# Patient Record
Sex: Male | Born: 1980 | Race: White | Hispanic: No | Marital: Married | Smoking: Former smoker
Health system: Southern US, Community
[De-identification: ages and names within clinical notes are randomized; demographics above are authoritative.]

## PROBLEM LIST (undated history)

## (undated) DIAGNOSIS — M1990 Unspecified osteoarthritis, unspecified site: Secondary | ICD-10-CM

## (undated) HISTORY — PX: OTHER SURGICAL HISTORY: SHX169

---

## 2011-08-21 ENCOUNTER — Encounter: Payer: Self-pay | Admitting: Family Medicine

## 2011-08-21 ENCOUNTER — Inpatient Hospital Stay (INDEPENDENT_AMBULATORY_CARE_PROVIDER_SITE_OTHER)
Admission: RE | Admit: 2011-08-21 | Discharge: 2011-08-21 | Disposition: A | Discharge status: Home / Self Care | Payer: Managed Care, Other (non HMO) | Source: Ambulatory Visit | Attending: Family Medicine | Admitting: Family Medicine

## 2011-08-21 DIAGNOSIS — L02419 Cutaneous abscess of limb, unspecified: Secondary | ICD-10-CM

## 2011-08-22 ENCOUNTER — Telehealth (INDEPENDENT_AMBULATORY_CARE_PROVIDER_SITE_OTHER): Payer: Self-pay | Admitting: Emergency Medicine

## 2011-10-20 NOTE — Progress Notes (Signed)
Summary: staph infection on right inside of thigh Room 4   Vital Signs:  Patient Profile:   30 Years Old Male CC:      Cellulitis on inside rt thigh dx Tuesday, hurts worse has not gotten any larger Height:     71 inches Weight:      252 pounds O2 Sat:      99 % O2 treatment:    Room Air Temp:     98.4 degrees F oral Pulse rate:   57 / minute Pulse rhythm:   regular Resp:     14 per minute BP sitting:   113 / 73  (left arm) Cuff size:   regular  Vitals Entered By: Emilio Math (August 21, 2011 11:02 AM)                  Current Allergies: No known allergies History of Present Illness Chief Complaint: Cellulitis on inside rt thigh dx Tuesday, hurts worse has not gotten any larger History of Present Illness:  Subjective:  Patient complains of onset of soreness on his right inner thigh about  5 days ago, then developed an area of redness and increased soreness 4 days ago.  No known trauma or insect bite.  No fevers, chills, and sweats.  No rash elsewhere.  No GI, GU, or respiratory symptoms.  He feels well otherwise.  Two days ago he was started on Keflex 500mg  two times a day, and doxycycline 100mg  two times a day, but has seen no improvement.  Current Meds CEPHALEXIN 500 MG CAPS (CEPHALEXIN) One by mouth Q6hr DOXYCYCLINE HYCLATE 100 MG CAPS (DOXYCYCLINE HYCLATE)   REVIEW OF SYSTEMS Constitutional Symptoms      Denies fever, chills, night sweats, weight loss, weight gain, and fatigue.  Eyes       Denies change in vision, eye pain, eye discharge, glasses, contact lenses, and eye surgery. Ear/Nose/Throat/Mouth       Denies hearing loss/aids, change in hearing, ear pain, ear discharge, dizziness, frequent runny nose, frequent nose bleeds, sinus problems, sore throat, hoarseness, and tooth pain or bleeding.  Respiratory       Denies dry cough, productive cough, wheezing, shortness of breath, asthma, bronchitis, and emphysema/COPD.  Cardiovascular       Denies murmurs, chest  pain, and tires easily with exhertion.    Gastrointestinal       Denies stomach pain, nausea/vomiting, diarrhea, constipation, blood in bowel movements, and indigestion. Genitourniary       Denies painful urination, kidney stones, and loss of urinary control. Neurological       Denies paralysis, seizures, and fainting/blackouts. Musculoskeletal       Denies muscle pain, joint pain, joint stiffness, decreased range of motion, redness, swelling, muscle weakness, and gout.  Skin       Denies bruising, unusual mles/lumps or sores, and hair/skin or nail changes.      Comments: Skin redness and soreness Psych       Denies mood changes, temper/anger issues, anxiety/stress, speech problems, depression, and sleep problems.  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  Extraocular movement is intact.  Conjunctivae are not inflamed.  Mouth/pharynx:  No lesions Neck:  Supple.  No adenopathy is present. Lungs:  Clear to auscultation.  Breath sounds are equal.  Heart:  Regular rate and rhythm without murmurs, rubs, or gallops.  Abdomen:  Nontender without masses or hepatosplenomegaly.  Bowel sounds are present.  No CVA or flank tenderness.  Skin:  On right inner thigh is a 12cm dia of macular erythema and slight warmth.  Centrally there is some subcutaneous tender induration about 3cm dia.  No surface lesions.  No fluctuance. CBC:  WBC 8.1 ; LY 28.9, MO 8.3, GR 62.8; Hgb 16.1  Assessment New Problems: CELLULITIS, LEG, RIGHT (ICD-682.6)  SUPERFICIAL CELLULITIS ? ETIOLOGY;  ? MRSA  Plan New Medications/Changes: CEPHALEXIN 500 MG CAPS (CEPHALEXIN) One by  mouth Q6hr  #20 x 0, 08/21/2011, Donna Christen MD  New Orders: CBC w/Diff [[id number]] New Patient Level IV [09811] Planning Comments:   Erythema outlined with skin marker. Continue doxycycline.  Increase Keflex 500mg  to QID.  Elevate.  Begin applying warm compresses.  Return for recheck (or follow-up with PCP) if not improved 48 hours.  Return if develops increased swelling/pain (may need I and D)   The patient and/or caregiver has been counseled thoroughly with regard to medications prescribed including dosage, schedule, interactions, rationale for use, and possible side effects and they verbalize understanding.  Diagnoses and expected course of recovery discussed and will return if not improved as expected or if the condition worsens. Patient and/or caregiver verbalized understanding.  Prescriptions: CEPHALEXIN 500 MG CAPS (CEPHALEXIN) One by mouth Q6hr  #20 x 0   Entered and Authorized by:   Donna Christen MD   Signed by:   Donna Christen MD on 08/21/2011   Method used:   Print then Give to Patient   RxID:   [id number]   Orders Added: 1)  CBC w/Diff [[id number]] 2)  New Patient Level IV [84132]

## 2011-10-20 NOTE — Telephone Encounter (Signed)
  Phone Note Outgoing Call Call back at [facility] Phone [phone]   Call placed by: Emilio Math,  August 22, 2011 3:48 PM Call placed to: Patient Summary of Call: Called patient spoke with wife he has been putting hot compresses and taking meds.

## 2011-10-20 NOTE — Letter (Signed)
Summary: Out of Work  [facility]  [address]   [place], [place] [address]   Phone: [phone]  Fax: [phone]    August 21, 2011   Employee:  Christopher Donaldson    To Whom It May Concern:   For Medical reasons, please excuse the above named employee from work today.   If you need additional information, please feel free to contact our office.         Sincerely,    Donna Christen MD

## 2016-05-01 ENCOUNTER — Encounter: Payer: Self-pay | Admitting: Emergency Medicine

## 2016-05-01 ENCOUNTER — Emergency Department (INDEPENDENT_AMBULATORY_CARE_PROVIDER_SITE_OTHER)
Admission: EM | Admit: 2016-05-01 | Discharge: 2016-05-01 | Disposition: A | Discharge status: Home / Self Care | Payer: BLUE CROSS/BLUE SHIELD | Source: Home / Self Care | Attending: Family Medicine | Admitting: Family Medicine

## 2016-05-01 DIAGNOSIS — J028 Acute pharyngitis due to other specified organisms: Principal | ICD-10-CM

## 2016-05-01 DIAGNOSIS — B9789 Other viral agents as the cause of diseases classified elsewhere: Principal | ICD-10-CM

## 2016-05-01 DIAGNOSIS — J029 Acute pharyngitis, unspecified: Secondary | ICD-10-CM | POA: Diagnosis not present

## 2016-05-01 LAB — POCT RAPID STREP A (OFFICE): Rapid Strep A Screen: NEGATIVE

## 2016-05-01 MED ORDER — AZITHROMYCIN 250 MG PO TABS: ORAL_TABLET | ORAL | Status: DC

## 2016-05-01 NOTE — ED Provider Notes (Signed)
CSN: [id number]     Arrival date & time 05/01/16  1929 History   First MD Initiated Contact with Patient 05/01/16 1951     Chief Complaint  Patient presents with  . Sore Throat     HPI Comments: Patient complains of one week history of typical cold-like symptoms developing over several days,  including mild sore throat, sinus congestion, headache, fatigue, and cough.   The history is provided by the patient.    History reviewed. No pertinent past medical history. History reviewed. No pertinent past surgical history. No family history on file. Social History  Substance Use Topics  . Smoking status: Former Games developermoker  . Smokeless tobacco: None  . Alcohol Use: Yes    Review of Systems + sore throat No cough No pleuritic pain No wheezing + nasal congestion + post-nasal drainage No sinus pain/pressure No itchy/red eyes No earache No hemoptysis No SOB + fever, + chills + nausea No vomiting No abdominal pain + diarrhea, resolved No urinary symptoms No skin rash + fatigue + myalgias + headache Used OTC meds without relief  Allergies  Review of patient's allergies indicates no known allergies.  Home Medications   Prior to Admission medications   Medication Sig Start Date End Date Taking? Authorizing Provider  azithromycin (ZITHROMAX Z-PAK) 250 MG tablet Take 2 tabs today; then begin one tab once daily for 4 more days. (Rx void after 05/09/16) 05/01/16   Lattie HawStephen A Beese, MD   Meds Ordered and Administered this Visit  Medications - No data to display  BP 111/74 mmHg  Pulse 75  Temp(Src) 98.2 F (36.8 C) (Oral)  Ht 5\' 11"  (1.803 m)  Wt 220 lb (99.791 kg)  BMI 30.70 kg/m2  SpO2 100% No data found.   Physical Exam Nursing notes and Vital Signs reviewed. Appearance:  Patient appears stated age, and in no acute distress Eyes:  Pupils are equal, round, and reactive to light and accomodation.  Extraocular movement is intact.  Conjunctivae are not inflamed  Ears:  Canals  normal.  Tympanic membranes normal.  Nose:  Mildly congested turbinates.  No sinus tenderness.    Pharynx:  Minimal erythema Neck:  Supple.  Tender enlarged posterior/lateral nodes are palpated bilaterally  Lungs:  Clear to auscultation.  Breath sounds are equal.  Moving air well. Heart:  Regular rate and rhythm without murmurs, rubs, or gallops.  Abdomen:  Nontender without masses or hepatosplenomegaly.  Bowel sounds are present.  No CVA or flank tenderness.  Extremities:  No edema.  Skin:  No rash present.   ED Course  Procedures none    Labs Reviewed  POCT RAPID STREP A (OFFICE) negative      MDM   1. Acute viral pharyngitis    There is no evidence of bacterial infection today.  Treat symptomatically for now  Take plain guaifenesin (1200mg  extended release tabs such as Mucinex) twice daily, with plenty of water, for cough and congestion.  May add Pseudoephedrine (30mg , one or two every 4 to 6 hours) for sinus congestion.  Get adequate rest.   May use Afrin nasal spray (or generic oxymetazoline) twice daily for about 5 days and then discontinue.  Also recommend using saline nasal spray several times daily and saline nasal irrigation (AYR is a common brand).  Use Flonase nasal spray each morning after using Afrin nasal spray and saline nasal irrigation. Try warm salt water gargles for sore throat.  May take Delsym Cough Suppressant at bedtime for nighttime cough.  Stop all antihistamines for now, and other non-prescription cough/cold preparations. May take Ibuprofen , 4 tabs every 8 hours with food for headache, sore throat, etc. Begin Azithromycin if not improving about 5 days or if persistent fever develops (Given a prescription to hold, with an expiration date)  Follow-up with family doctor if not improving about 7 to10 days.     Lattie Haw, MD 05/05/16 [phone]

## 2016-05-01 NOTE — Discharge Instructions (Signed)
Take plain guaifenesin (1200mg  extended release tabs such as Mucinex) twice daily, with plenty of water, for cough and congestion.  May add Pseudoephedrine (30mg , one or two every 4 to 6 hours) for sinus congestion.  Get adequate rest.   May use Afrin nasal spray (or generic oxymetazoline) twice daily for about 5 days and then discontinue.  Also recommend using saline nasal spray several times daily and saline nasal irrigation (AYR is a common brand).  Use Flonase nasal spray each morning after using Afrin nasal spray and saline nasal irrigation. Try warm salt water gargles for sore throat.  May take Delsym Cough Suppressant at bedtime for nighttime cough.  Stop all antihistamines for now, and other non-prescription cough/cold preparations. May take Ibuprofen 200mg , 4 tabs every 8 hours with food for headache, sore throat, etc. Begin Azithromycin if not improving about 5 days or if persistent fever develops   Follow-up with family doctor if not improving about 7 to10 days.

## 2016-05-01 NOTE — ED Notes (Signed)
Sore throat, chills, sweats, no appetite, sinus drainage, malaise, headache x 1 week

## 2017-01-10 ENCOUNTER — Ambulatory Visit (INDEPENDENT_AMBULATORY_CARE_PROVIDER_SITE_OTHER): Payer: Worker's Compensation | Admitting: Family Medicine

## 2017-01-10 VITALS — BP 108/76 | HR 64 | Temp 98.8°F | Resp 18 | Ht 70.0 in | Wt 227.0 lb

## 2017-01-10 DIAGNOSIS — S7011XA Contusion of right thigh, initial encounter: Secondary | ICD-10-CM

## 2017-01-10 DIAGNOSIS — M25561 Pain in right knee: Secondary | ICD-10-CM | POA: Diagnosis not present

## 2017-01-10 NOTE — Patient Instructions (Addendum)
  Your knee looks good today.  Everything checks out well.    You will be sore for the next several days.  You can take some Tylenol or Advil if you need to for relief.  Ice on your knee can also help with the soreness.  You should be able to return back to work.  If you're having any trouble with your knee in the next several days, come back and see Christopher Donaldson.    It was good to meet you today   IF you received an x-ray today, you will receive an invoice from [facility]. Please contact [facility] at [phone] with questions or concerns regarding your invoice.   IF you received labwork today, you will receive an invoice from [facility]. Please contact [facility] at [phone] with questions or concerns regarding your invoice.   Our billing staff will not be able to assist you with questions regarding bills from these companies.  You will be contacted with the lab results as soon as they are available. The fastest way to get your results is to activate your My Chart account. Instructions are located on the last page of this paperwork. If you have not heard from Christopher Donaldson regarding the results in 2 weeks, please contact this office.

## 2017-01-10 NOTE — Progress Notes (Signed)
Christopher MeierJames Radebaugh 10/26/1981 35 y.o.   Chief Complaint  Patient presents with  . Knee Pain    WC, yesterday    Presents for evaluation of work-related complaint.  Date of Injury: 01/10/2016 - afternoon, unsure exact time  History of Present Illness:  Right leg pain:  Patient works with heavy machinery.  He was at work yesterday when crane slipped and large panel fell onto his leg and floor.  States the panel weighs about 1000 lbs.  Floor took most of the brunt of the fall, but he was still pinned underneath.  Coworkers able to lift the panel off of him.    He had some immediate pain and soreness, but was able to "walk it off."  He was also able to finish his shift.  Worked for the next severla hours, including Bending, squatting, climbing up onto a truck. He noted some bruising along his thigh. He also had some soreness in his right knee. However he did not noticed any bruising along his knee. He noted a scrape along the front of his shin without much pain.  Went home last night.  Able to sleep well.  Felt "a little sore" this AM.  Was not going to come be evaluated except states that wife "nagged" him into coming.  Has not needed any OTC analgesics.    No injury to either knee prior.  Has had "crushed ankle" in past.  Has chronic swelling and bruising here.    ROS The patient denies fever, unusual weight change, decreased hearing, chest pain, palpitations, pre-syncopal or syncopal episodes, dyspnea on exertion, prolonged cough, hemoptysis, change in bowel habits, melena, hematochezia,  nausea/vomiting/abdominal pain, abnormal bleeding, or enlarged lymph nodes.  No muscle weakness.  Soreness as above.    Current medications and allergies reviewed and updated. Past medical history, family history, social history have been reviewed and updated.   Physical Exam  Gen:  Alert, cooperative patient who appears stated age in no acute distress.  Vital signs reviewed. Head: Kelly/AT.   Eyes:   EOMI, PERRL.   Ears:  External ears WNL, Bilateral TM's normal without retraction, redness or bulging. Nose:  Septum midline  Mouth:  MMM, tonsils non-erythematous, non-edematous.   Cardiac:  Regular rate and rhythm without murmur auscultated.  Good S1/S2. Pulm:  Clear to auscultation bilaterally with good air movement.  No wheezes or rales noted.   Abd:  Soft/nondistended/nontender.  Good bowel sounds throughout all four quadrants.  No masses noted.  Ext:  Some mild swelling of Right ankle with varicose veins and no acute bruising here. Does have very superficial abrasion noted anterior aspect of Right shin but without bruising beneath and without any pain.  No LE edema on Left.   MSK:  Left leg WNL - Right leg w/ bruising anterior aspect Right thigh about 3 cm in diameter.  Minimally TTP along bruise but thigh otherwise without pain/tenderness.  Able to fully flex knee without pain.  Does ambulate with slight antalgic gain.  No edema to right knee. No effusion. No bruising of knee. No tenderness to palpation along medial lateral joint line. No patellar tenderness. No crepitus noted. No patellofemoral pain. Anterior-posterior drawer tests are negative. Lachman is negative. McMurray's daily. No medial or lateral joint laxity. No pain or tenderness with varus and valgus testing.     Assessment and Plan: 1.  Right leg contusion: - s/p large panel falling on his leg.  Sounds like the ground took the majority  of the weight of this panel for which he is fortunate.   - complete testing today is negative.  He himself wasn't worried that he is injured, he states he only came because his wife forced him to come. - Able to finish his work yesterday fully.   - does have some mild soreness today as expected, but not enough to warrant OTC analgesics - can take these if pain persists.  - if worsens or otherwise he is not improving in next several days, should follow up for re-examination - able to return back  to work without restrictions, which he did yesterday himself anyway.

## 2018-02-22 ENCOUNTER — Other Ambulatory Visit: Payer: Self-pay | Admitting: Occupational Medicine

## 2018-02-22 ENCOUNTER — Ambulatory Visit: Payer: Self-pay

## 2018-02-22 DIAGNOSIS — M25521 Pain in right elbow: Secondary | ICD-10-CM

## 2018-04-04 ENCOUNTER — Encounter: Payer: Self-pay | Admitting: Emergency Medicine

## 2018-04-04 ENCOUNTER — Emergency Department
Admission: EM | Admit: 2018-04-04 | Discharge: 2018-04-04 | Disposition: A | Discharge status: Home / Self Care | Payer: BLUE CROSS/BLUE SHIELD | Source: Home / Self Care | Attending: Family Medicine | Admitting: Family Medicine

## 2018-04-04 DIAGNOSIS — J069 Acute upper respiratory infection, unspecified: Secondary | ICD-10-CM

## 2018-04-04 MED ORDER — AZITHROMYCIN 250 MG PO TABS: 250.0000 mg | ORAL_TABLET | Freq: Every day | ORAL | 0 refills | Status: DC

## 2018-04-04 NOTE — ED Triage Notes (Signed)
Patient complaining of dry cough x 1 week, chest tightness, sinus problems, headaches, no fever, has taken Mucinex and Tessalon Pearles.

## 2018-04-04 NOTE — Discharge Instructions (Signed)
°  You may take  acetaminophen every 4-6 hours or in combination with ibuprofen 400-600mg  every 6-8 hours as needed for pain, inflammation, and fever.  Be sure to drink at least eight 8oz glasses of water to stay well hydrated and get at least 8 hours of sleep at night, preferably more while sick.   You may continue to take muccinex or try a different over the counter cough medication for at night such as Nyquil, which has benadryl to help with cough and sleep.  Generic over the counter medications are okay to use as well.   Please take antibiotics as prescribed and be sure to complete entire course even if you start to feel better to ensure infection does not come back.  Please follow up with a family medicine doctor in 7-10 days if not improving.

## 2018-04-04 NOTE — ED Provider Notes (Signed)
Christopher Donaldson [facility]    CSN: [id number] Arrival date & time: 04/04/18  1612     History   Chief Complaint Chief Complaint  Patient presents with  . Cough    HPI Christopher Donaldson is a 37 y.o. male.   HPI  Christopher Donaldson is a 37 y.o. male presenting to UC with c/o 1 week worsening cough with chest tightness and sinus pressure and congestion with HAs.  He was in the [place] last week on a cruise but wife believes he got sick sitting in the airport.  He has taken mucinex and tried tessalon with minimal relief.  Subjective fever the first 2 days with sweats and chills.  No n/v/d. Denies SOB. No hx of asthma.    History reviewed. No pertinent past medical history.  There are no active problems to display for this patient.   History reviewed. No pertinent surgical history.     Home Medications    Prior to Admission medications   Medication Sig Start Date End Date Taking? Authorizing Provider  azithromycin (ZITHROMAX) 250 MG tablet Take 1 tablet (250 mg total) by mouth daily. Take first 2 tablets together, then 1 every day until finished. 04/04/18   Lurene Shadow, PA-C    Family History No family history on file.  Social History Social History   Tobacco Use  . Smoking status: Current Every Day Smoker  . Smokeless tobacco: Never Used  Substance Use Topics  . Alcohol use: Yes  . Drug use: Not on file     Allergies   Patient has no known allergies.   Review of Systems Review of Systems  Constitutional: Positive for fever. Negative for chills.  HENT: Positive for congestion, rhinorrhea, sinus pressure and sinus pain. Negative for ear pain, sore throat, trouble swallowing and voice change.   Respiratory: Positive for cough and chest tightness. Negative for shortness of breath.   Cardiovascular: Negative for chest pain and palpitations.  Gastrointestinal: Negative for abdominal pain, diarrhea, nausea and vomiting.  Musculoskeletal: Negative for arthralgias, back pain  and myalgias.  Skin: Negative for rash.  Neurological: Positive for headaches. Negative for dizziness and light-headedness.     Physical Exam Triage Vital Signs ED Triage Vitals [04/04/18 1639]  Enc Vitals Group     BP 115/70     Pulse Rate 80     Resp      Temp 98.3 F (36.8 C)     Temp Source Oral     SpO2 99%     Weight 217 lb 4 oz (98.5 kg)     Height  (1.803 m)     Head Circumference      Peak Flow      Pain Score 0     Pain Loc      Pain Edu?      Excl. in GC?    No data found.  Updated Vital Signs BP 115/70 (BP Location: Right Arm)   Pulse 80   Temp 98.3 F (36.8 C) (Oral)   Ht  (1.803 m)   Wt 217 lb 4 oz (98.5 kg)   SpO2 99%   BMI 30.30 kg/m   Visual Acuity Right Eye Distance:   Left Eye Distance:   Bilateral Distance:    Right Eye Near:   Left Eye Near:    Bilateral Near:     Physical Exam  Constitutional: He is oriented to person, place, and time. He appears well-developed and well-nourished. No distress.  HENT:  Head: Normocephalic and atraumatic.  Right Ear: Tympanic membrane normal.  Left Ear: Tympanic membrane normal.  Nose: Mucosal edema present. Right sinus exhibits no maxillary sinus tenderness and no frontal sinus tenderness. Left sinus exhibits no maxillary sinus tenderness and no frontal sinus tenderness.  Mouth/Throat: Uvula is midline, oropharynx is clear and moist and mucous membranes are normal.  Eyes: EOM are normal.  Neck: Normal range of motion. Neck supple.  Cardiovascular: Normal rate and regular rhythm.  Pulmonary/Chest: Effort normal and breath sounds normal. No stridor. No respiratory distress. He has no wheezes. He has no rales.  Musculoskeletal: Normal range of motion.  Lymphadenopathy:    He has no cervical adenopathy.  Neurological: He is alert and oriented to person, place, and time.  Skin: Skin is warm and dry. He is not diaphoretic.  Psychiatric: He has a normal mood and affect. His behavior is normal.   Nursing note and vitals reviewed.    UC Treatments / Results  Labs (all labs ordered are listed, but only abnormal results are displayed) Labs Reviewed - No data to display  EKG None  Radiology No results found.  Procedures Procedures (including critical care time)  Medications Ordered in UC Medications - No data to display  Initial Impression / Assessment and Plan / UC Course  I have reviewed the triage vital signs and the nursing notes.  Pertinent labs & imaging results that were available during my care of the patient were reviewed by me and considered in my medical decision making (see chart for details).     Will cover for atypical bacterial infection given recent travel. Home care instructions provided below.    Final Clinical Impressions(s) / UC Diagnoses   Final diagnoses:  Upper respiratory tract infection, unspecified type     Discharge Instructions      You may take  acetaminophen every 4-6 hours or in combination with ibuprofen 400-600mg  every 6-8 hours as needed for pain, inflammation, and fever.  Be sure to drink at least eight 8oz glasses of water to stay well hydrated and get at least 8 hours of sleep at night, preferably more while sick.   You may continue to take muccinex or try a different over the counter cough medication for at night such as Nyquil, which has benadryl to help with cough and sleep.  Generic over the counter medications are okay to use as well.   Please take antibiotics as prescribed and be sure to complete entire course even if you start to feel better to ensure infection does not come back.  Please follow up with a family medicine doctor in 7-10 days if not improving.     ED Prescriptions    Medication Sig Dispense Auth. Provider   azithromycin (ZITHROMAX) 250 MG tablet Take 1 tablet (250 mg total) by mouth daily. Take first 2 tablets together, then 1 every day until finished. 6 tablet Lurene Shadow, PA-C      Controlled Substance Prescriptions [place] Controlled Substance Registry consulted? Not Applicable   Rolla Plate 04/04/18 6045

## 2018-09-23 IMAGING — DX DG ELBOW COMPLETE 3+V*R*
4 series · 4 of 4 positions shown · non-contrast
Comparison: None.

CLINICAL DATA: Fell on 02/20/2018. Posterior elbow pain with
abrasions.

EXAM:
RIGHT ELBOW - COMPLETE 3+ VIEW

[elbow ap]
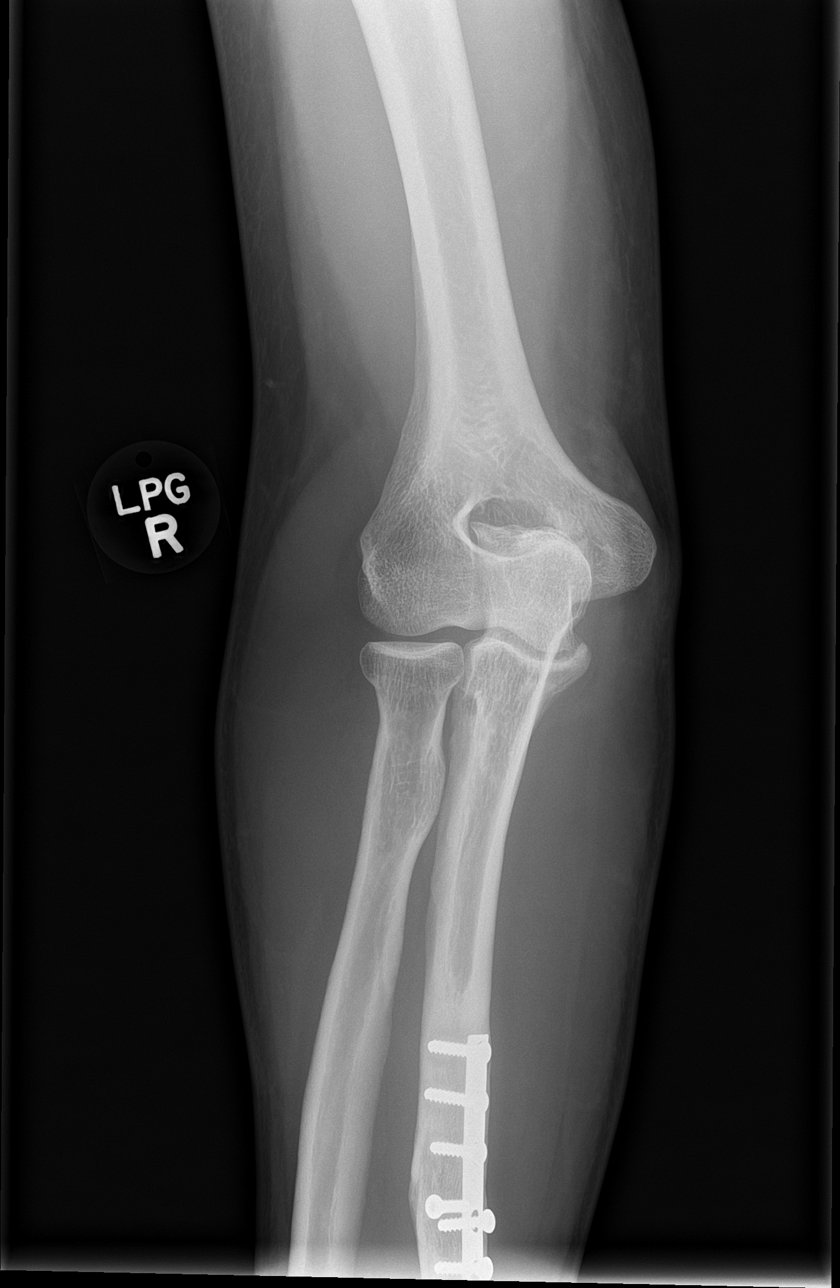

[elbow obl (1 of 2)]
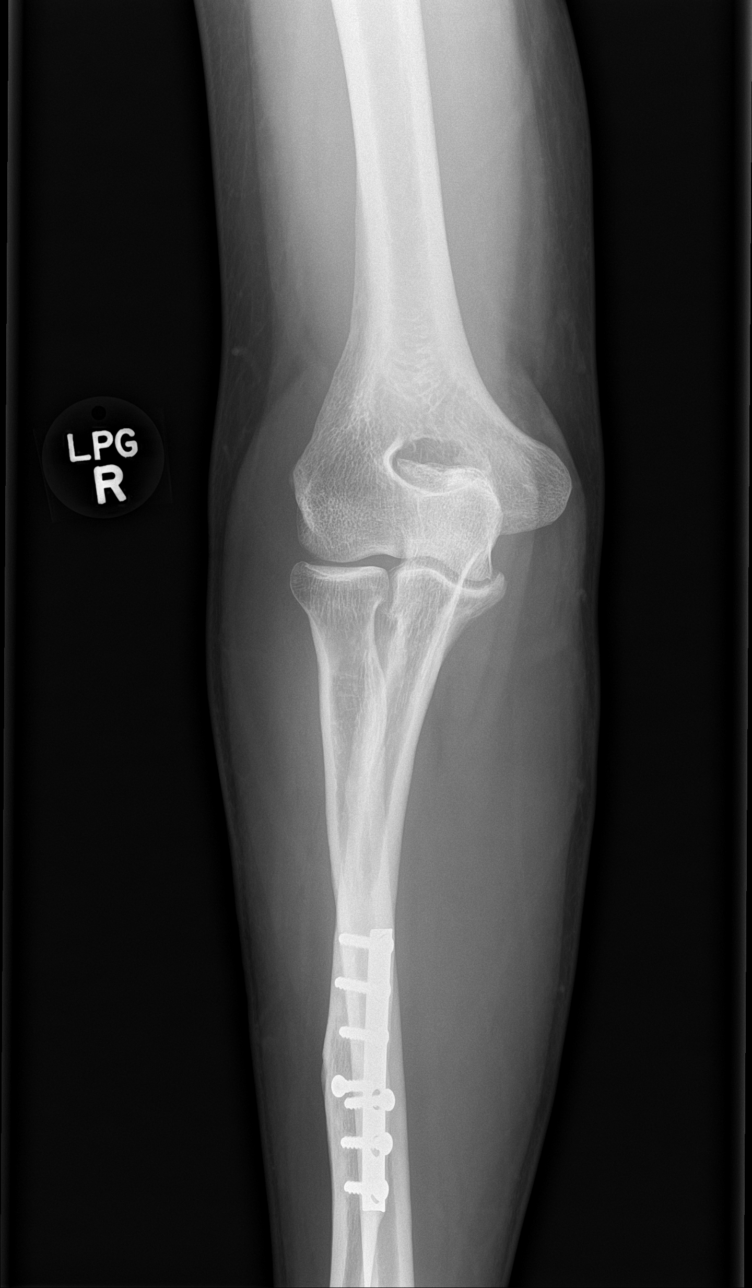

[elbow obl (2 of 2)]
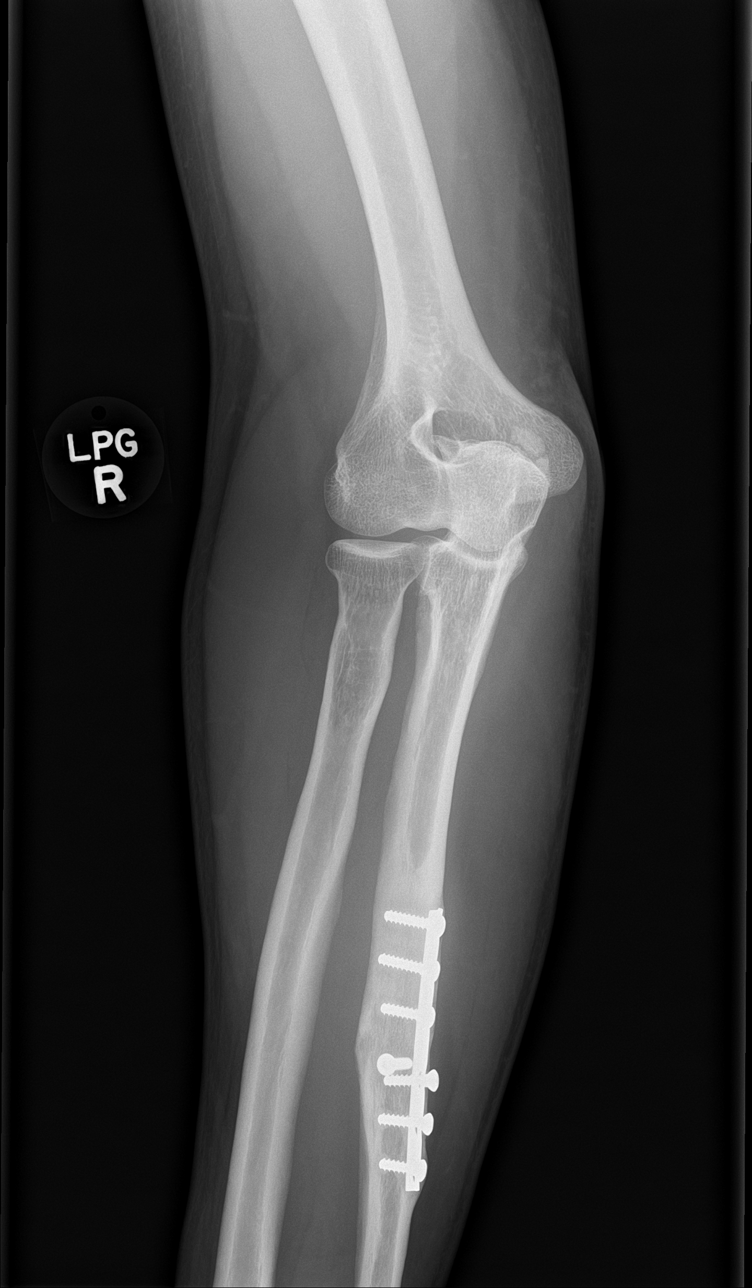

[elbow lat]
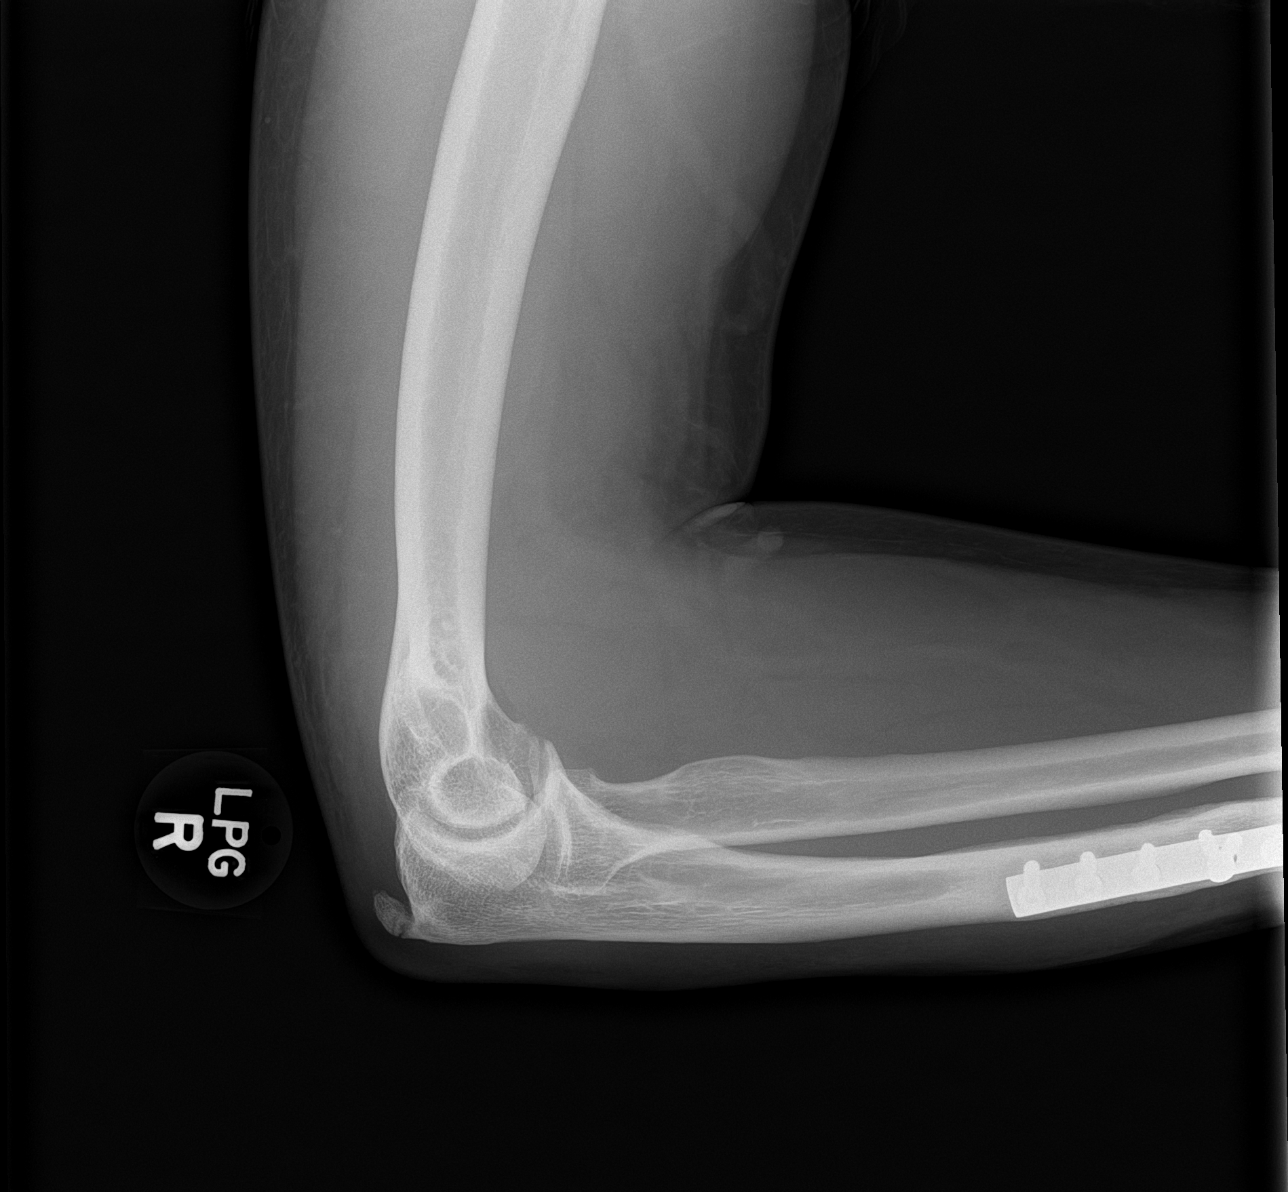

[4 of 4 positions shown; findings below may reference images not displayed]

FINDINGS: Plate and screw fixation in the mid ulna. The right elbow is
located. Prominent spur at the olecranon compatible with
enthesopathic changes. Negative for an acute fracture. No evidence
for an elbow joint effusion. Degenerative changes at the coronoid
process of the ulna.
IMPRESSION: No acute bone abnormality to the right elbow.

Degenerative and post surgical changes as described.

## 2019-08-01 ENCOUNTER — Other Ambulatory Visit: Payer: Self-pay

## 2019-08-01 ENCOUNTER — Emergency Department
Admission: EM | Admit: 2019-08-01 | Discharge: 2019-08-01 | Disposition: A | Discharge status: Home / Self Care | Payer: BC Managed Care – PPO | Source: Home / Self Care

## 2019-08-01 ENCOUNTER — Encounter: Payer: Self-pay | Admitting: Emergency Medicine

## 2019-08-01 DIAGNOSIS — M5412 Radiculopathy, cervical region: Secondary | ICD-10-CM | POA: Diagnosis not present

## 2019-08-01 DIAGNOSIS — S46811A Strain of other muscles, fascia and tendons at shoulder and upper arm level, right arm, initial encounter: Secondary | ICD-10-CM

## 2019-08-01 MED ORDER — CYCLOBENZAPRINE HCL 5 MG PO TABS: 5.0000 mg | ORAL_TABLET | Freq: Two times a day (BID) | ORAL | 0 refills | Status: AC | PRN

## 2019-08-01 MED ORDER — PREDNISONE 50 MG PO TABS: 50.0000 mg | ORAL_TABLET | Freq: Every day | ORAL | 0 refills | Status: AC

## 2019-08-01 NOTE — ED Triage Notes (Signed)
RT shoulder and neck pain, tingling in RT hand, headaches x 3 weeks

## 2019-08-01 NOTE — ED Provider Notes (Signed)
[facility]    CSN: [id number] Arrival date & time: 08/01/19  1602      History   Chief Complaint Chief Complaint  Patient presents with  . Shoulder Pain    HPI Christopher Donaldson is a 38 y.o. male.   HPI  Christopher Donaldson is a 38 y.o. male presenting to UC with c/o persistent Right side neck and shoulder pain, with intermittent Right hand numbness and mild generalized HA for about 3 weeks.  He is Right hand dominant. No known injury.  He has tried Tylenol on occasion with mild relief.  Pain is aching and sore, 7/10.     History reviewed. No pertinent past medical history.  There are no active problems to display for this patient.   History reviewed. No pertinent surgical history.     Home Medications    Prior to Admission medications   Medication Sig Start Date End Date Taking? Authorizing Provider  cyclobenzaprine (FLEXERIL) 5 MG tablet Take 1-2 tablets (5-10 mg total) by mouth 2 (two) times daily as needed for muscle spasms. 08/01/19   Noe Gens, PA-C  predniSONE (DELTASONE) 50 MG tablet Take 1 tablet (50 mg total) by mouth daily with breakfast for 5 days. 08/01/19 08/06/19  Noe Gens, PA-C    Family History Family History  Problem Relation Age of Onset  . Healthy Mother   . Healthy Father     Social History Social History   Tobacco Use  . Smoking status: Current Every Day Smoker  . Smokeless tobacco: Never Used  Substance Use Topics  . Alcohol use: Yes  . Drug use: Not on file     Allergies   Patient has no known allergies.   Review of Systems Review of Systems  Constitutional: Negative for chills and fever.  Musculoskeletal: Positive for arthralgias, back pain, myalgias and neck pain. Negative for joint swelling and neck stiffness.  Skin: Negative for color change.  Neurological: Positive for numbness. Negative for weakness.     Physical Exam Triage Vital Signs ED Triage Vitals  Enc Vitals Group     BP 08/01/19 1653 117/71    Pulse Rate 08/01/19 1653 (!) 54     Resp --      Temp 08/01/19 1653 98 F (36.7 C)     Temp Source 08/01/19 1653 Oral     SpO2 --      Weight 08/01/19 1654 230 lb (104.3 kg)     Height 08/01/19 1654 5\' 11"  (1.803 m)     Head Circumference --      Peak Flow --      Pain Score 08/01/19 1654 7     Pain Loc --      Pain Edu? --      Excl. in [place]? --    No data found.  Updated Vital Signs BP 117/71 (BP Location: Right Arm)   Pulse (!) 54   Temp 98 F (36.7 C) (Oral)   Ht 5\' 11"  (1.803 m)   Wt 230 lb (104.3 kg)   BMI 32.08 kg/m   Visual Acuity Right Eye Distance:   Left Eye Distance:   Bilateral Distance:    Right Eye Near:   Left Eye Near:    Bilateral Near:     Physical Exam Vitals signs and nursing note reviewed.  Constitutional:      Appearance: Normal appearance. He is well-developed.  HENT:     Head: Normocephalic and atraumatic.     Mouth/Throat:  Mouth: Mucous membranes are moist.  Neck:     Musculoskeletal: Normal range of motion and neck supple. No neck rigidity or muscular tenderness.  Cardiovascular:     Rate and Rhythm: Normal rate and regular rhythm.     Pulses:          Radial pulses are 2+ on the right side.  Pulmonary:     Effort: Pulmonary effort is normal.  Musculoskeletal: Normal range of motion.        General: Tenderness present.       Back:     Comments: No spinal tenderness. Mild tenderness to Right upper trapezius. Full ROM Right shoulder, elbow and hand w/o crepitus or pain. 5/5 grip strength bilaterally.  Skin:    General: Skin is warm and dry.     Capillary Refill: Capillary refill takes less than 2 seconds.  Neurological:     General: No focal deficit present.     Mental Status: He is alert and oriented to person, place, and time.  Psychiatric:        Behavior: Behavior normal.      UC Treatments / Results  Labs (all labs ordered are listed, but only abnormal results are displayed) Labs Reviewed - No data to display   EKG   Radiology No results found.  Procedures Procedures (including critical care time)  Medications Ordered in UC Medications - No data to display  Initial Impression / Assessment and Plan / UC Course  I have reviewed the triage vital signs and the nursing notes.  Pertinent labs & imaging results that were available during my care of the patient were reviewed by me and considered in my medical decision making (see chart for details).     Hx and exam c/w muscular strain with Right side cervical radiculopathy  Will tx conservatively and hold off on imaging at this time.   Final Clinical Impressions(s) / UC Diagnoses   Final diagnoses:  Right cervical radiculopathy  Trapezius strain, right, initial encounter     Discharge Instructions      Flexeril (cyclobenzaprine) is a muscle relaxer and may cause drowsiness. Do not drink alcohol, drive, or operate heavy machinery while taking.  You may take 500mg  acetaminophen every 4-6 hours or in combination with ibuprofen 400-600mg  every 6-8 hours as needed for pain and inflammation.   Please call to schedule a follow up visit with your family doctor or Sports Medicine in 1 week if not improving.     ED Prescriptions    Medication Sig Dispense Auth. Provider   cyclobenzaprine (FLEXERIL) 5 MG tablet Take 1-2 tablets (5-10 mg total) by mouth 2 (two) times daily as needed for muscle spasms. 30 tablet Doroteo GlassmanPhelps, Tayjon Halladay O, PA-C   predniSONE (DELTASONE) 50 MG tablet Take 1 tablet (50 mg total) by mouth daily with breakfast for 5 days. 5 tablet Lurene ShadowPhelps, Laurent Cargile O, PA-C     Controlled Substance Prescriptions [place] Controlled Substance Registry consulted? Not Applicable   Rolla Platehelps, Hannan Tetzlaff O, PA-C 08/02/19 [id number]

## 2019-08-01 NOTE — Discharge Instructions (Signed)
°  Flexeril (cyclobenzaprine) is a muscle relaxer and may cause drowsiness. Do not drink alcohol, drive, or operate heavy machinery while taking.  You may take 500mg  acetaminophen every 4-6 hours or in combination with ibuprofen 400-600mg  every 6-8 hours as needed for pain and inflammation.   Please call to schedule a follow up visit with your family doctor or Sports Medicine in 1 week if not improving.

## 2021-01-17 ENCOUNTER — Other Ambulatory Visit: Payer: Self-pay

## 2021-01-17 ENCOUNTER — Emergency Department (HOSPITAL_BASED_OUTPATIENT_CLINIC_OR_DEPARTMENT_OTHER)
Admission: EM | Admit: 2021-01-17 | Discharge: 2021-01-17 | Disposition: A | Discharge status: Home / Self Care | Payer: Worker's Compensation | Attending: Emergency Medicine | Admitting: Emergency Medicine

## 2021-01-17 ENCOUNTER — Encounter (HOSPITAL_BASED_OUTPATIENT_CLINIC_OR_DEPARTMENT_OTHER): Payer: Self-pay | Admitting: *Deleted

## 2021-01-17 ENCOUNTER — Emergency Department (HOSPITAL_BASED_OUTPATIENT_CLINIC_OR_DEPARTMENT_OTHER): Payer: Worker's Compensation

## 2021-01-17 DIAGNOSIS — S61211A Laceration without foreign body of left index finger without damage to nail, initial encounter: Secondary | ICD-10-CM | POA: Diagnosis present

## 2021-01-17 DIAGNOSIS — W208XXA Other cause of strike by thrown, projected or falling object, initial encounter: Secondary | ICD-10-CM | POA: Insufficient documentation

## 2021-01-17 DIAGNOSIS — Y9289 Other specified places as the place of occurrence of the external cause: Secondary | ICD-10-CM | POA: Insufficient documentation

## 2021-01-17 DIAGNOSIS — Z23 Encounter for immunization: Secondary | ICD-10-CM | POA: Insufficient documentation

## 2021-01-17 DIAGNOSIS — F17200 Nicotine dependence, unspecified, uncomplicated: Secondary | ICD-10-CM | POA: Insufficient documentation

## 2021-01-17 HISTORY — DX: Unspecified osteoarthritis, unspecified site: M19.90

## 2021-01-17 MED ORDER — TETANUS-DIPHTH-ACELL PERTUSSIS 5-2.5-18.5 LF-MCG/0.5 IM SUSY
0.5000 mL | PREFILLED_SYRINGE | Freq: Once | INTRAMUSCULAR | Status: AC
  Administered 2021-01-17: 0.5 mL via INTRAMUSCULAR
  Filled 2021-01-17: qty 0.5

## 2021-01-17 MED ORDER — LIDOCAINE HCL (PF) 1 % IJ SOLN
5.0000 mL | Freq: Once | INTRAMUSCULAR | Status: AC
  Administered 2021-01-17: 5 mL
  Filled 2021-01-17: qty 5

## 2021-01-17 NOTE — ED Triage Notes (Signed)
Workman's comp injury. A metal panel fell causing a laceration to his left index finger. Bleeding controlled.

## 2021-01-17 NOTE — Discharge Instructions (Addendum)
Have the stitches removed in 7 to 10 days.  He can return to the ER or follow-up with your doctor or another urgent care.  Watch for signs of infection.

## 2021-01-17 NOTE — ED Provider Notes (Signed)
[facility] EMERGENCY DEPARTMENT Provider Note   CSN: [id number] Arrival date & time: 01/17/21  1434     History Chief Complaint  Patient presents with  . Laceration    Christopher Donaldson is a 40 y.o. male.  HPI Patient presents with injury to the palmar aspect of his left index finger.  States it was a piece of metal at work.  It has been wrapped up and he states he did rinse it out a little bit.  States when he removed the dressing it bled more.  No other injury.  Unknown last tetanus.  Denies numbness or weakness.  States he is still able to bend it.  Not on anticoagulation.  States he is otherwise healthy.    Past Medical History:  Diagnosis Date  . Arthritis     There are no problems to display for this patient.   Past Surgical History:  Procedure Laterality Date  . arm surgery         Family History  Problem Relation Age of Onset  . Healthy Mother   . Healthy Father     Social History   Tobacco Use  . Smoking status: Current Every Day Smoker  . Smokeless tobacco: Never Used  Vaping Use  . Vaping Use: Never used  Substance Use Topics  . Alcohol use: Not Currently  . Drug use: Never    Home Medications Prior to Admission medications   Medication Sig Start Date End Date Taking? Authorizing Provider  cyclobenzaprine (FLEXERIL) 5 MG tablet Take 1-2 tablets (5-10 mg total) by mouth 2 (two) times daily as needed for muscle spasms. 08/01/19   Lurene Shadow, PA-C    Allergies    Patient has no known allergies.  Review of Systems   Review of Systems  Constitutional: Negative for fever.  Musculoskeletal:       Left index finger injury.  Skin: Positive for wound.  Neurological: Negative for weakness and numbness.    Physical Exam Updated Vital Signs BP 117/74 (BP Location: Right Arm)   Pulse 66   Temp 98.2 F (36.8 C) (Oral)   Resp 18   Ht 5\' 11"  (1.803 m)   Wt 94.8 kg   SpO2 100%   BMI 29.15 kg/m   Physical Exam Vitals and nursing  note reviewed.  Constitutional:      Appearance: Normal appearance.  Cardiovascular:     Rate and Rhythm: Regular rhythm.  Musculoskeletal:     Comments: Laceration across the palmar aspect of the distal phalanx of the left finger.  Approximately 1.5 cm long.  Appears to have good flexion and extension at the DIP PIP and MCP joint.  Does not involve the nail.  Skin:    Capillary Refill: Capillary refill takes less than 2 seconds.  Neurological:     Mental Status: He is alert and oriented to person, place, and time.     ED Results / Procedures / Treatments   Labs (all labs ordered are listed, but only abnormal results are displayed) Labs Reviewed - No data to display  EKG None  Radiology DG Finger Index Left  Result Date: 01/17/2021 CLINICAL DATA:  Left index finger laceration. EXAM: LEFT INDEX FINGER 2+V COMPARISON:  None. FINDINGS: Radiopaque gauze is seen overlying the left index finger with subsequently obscured osseous and soft tissue detail. There is no evidence of fracture or dislocation. There is no evidence of arthropathy or other focal bone abnormality. Soft tissues are unremarkable. IMPRESSION: Negative. Electronically  Signed   By: Aram Candela M.D.   On: 01/17/2021 15:32    Procedures .Marland KitchenLaceration Repair  Date/Time: 01/17/2021 7:35 PM Performed by: Benjiman Core, MD Authorized by: Benjiman Core, MD   Consent:    Consent obtained:  Verbal   Consent given by:  Patient   Risks, benefits, and alternatives were discussed: yes     Risks discussed:  Infection, poor cosmetic result, poor wound healing, retained foreign body, pain, need for additional repair and nerve damage   Alternatives discussed:  No treatment Universal protocol:    Procedure explained and questions answered to patient or proxy's satisfaction: yes     Patient identity confirmed:  Verbally with patient Anesthesia:    Anesthesia method:  Local infiltration and nerve block   Local  anesthetic:  Lidocaine 1% w/o epi   Block location:  Digital block left index finger   Block needle gauge:  27 G   Block anesthetic:  Lidocaine 1% w/o epi   Block technique:  Digital block   Block injection procedure:  Anatomic landmarks identified, introduced needle, incremental injection and negative aspiration for blood   Block outcome:  Incomplete block Laceration details:    Location:  Finger   Finger location:  L index finger   Length (cm):  2 Pre-procedure details:    Preparation:  Patient was prepped and draped in usual sterile fashion and imaging obtained to evaluate for foreign bodies Exploration:    Hemostasis achieved with:  Tourniquet   Imaging obtained: x-ray     Imaging outcome: foreign body not noted     Wound exploration: wound explored through full range of motion     Contaminated: no   Treatment:    Area cleansed with:  Saline   Amount of cleaning:  Standard   Irrigation solution:  Tap water   Irrigation method:  Tap and syringe   Visualized foreign bodies/material removed: yes     Debridement:  None   Undermining:  None   Scar revision: no   Skin repair:    Repair method:  Sutures   Suture size:  5-0   Suture material:  Prolene   Suture technique:  Simple interrupted   Number of sutures: Approximately 8. Approximation:    Approximation:  Close Repair type:    Repair type:  Intermediate Post-procedure details:    Dressing:  Sterile dressing   Procedure completion:  Tolerated well, no immediate complications Comments:     Also 1 deep suture of simple interrupted out of Vicryl Rapide.     Medications Ordered in ED Medications  lidocaine (PF) (XYLOCAINE) 1 % injection 5 mL (5 mLs Infiltration Given by Other 01/17/21 1601)  Tdap (BOOSTRIX) injection 0.5 mL (0.5 mLs Intramuscular Given 01/17/21 1601)    ED Course  I have reviewed the triage vital signs and the nursing notes.  Pertinent labs & imaging results that were available during my care of the  patient were reviewed by me and considered in my medical decision making (see chart for details).    MDM Rules/Calculators/A&P                          Patient with finger laceration.  X-ray reassuring.  Wound closed with sutures.  Tetanus updated.  No other apparent injury.  Follow-up as an outpatient for suture removal in 7 to 10 days. Final Clinical Impression(s) / ED Diagnoses Final diagnoses:  Laceration of left index finger without foreign body  without damage to nail, initial encounter    Rx / DC Orders ED Discharge Orders    None       Benjiman Core, MD 01/17/21 [phone]

## 2021-08-18 IMAGING — DX DG FINGER INDEX 2+V*L*
3 series · 3 of 3 positions shown · non-contrast
Comparison: None.

CLINICAL DATA: Left index finger laceration.

EXAM:
LEFT INDEX FINGER 2+V

[finger ap]
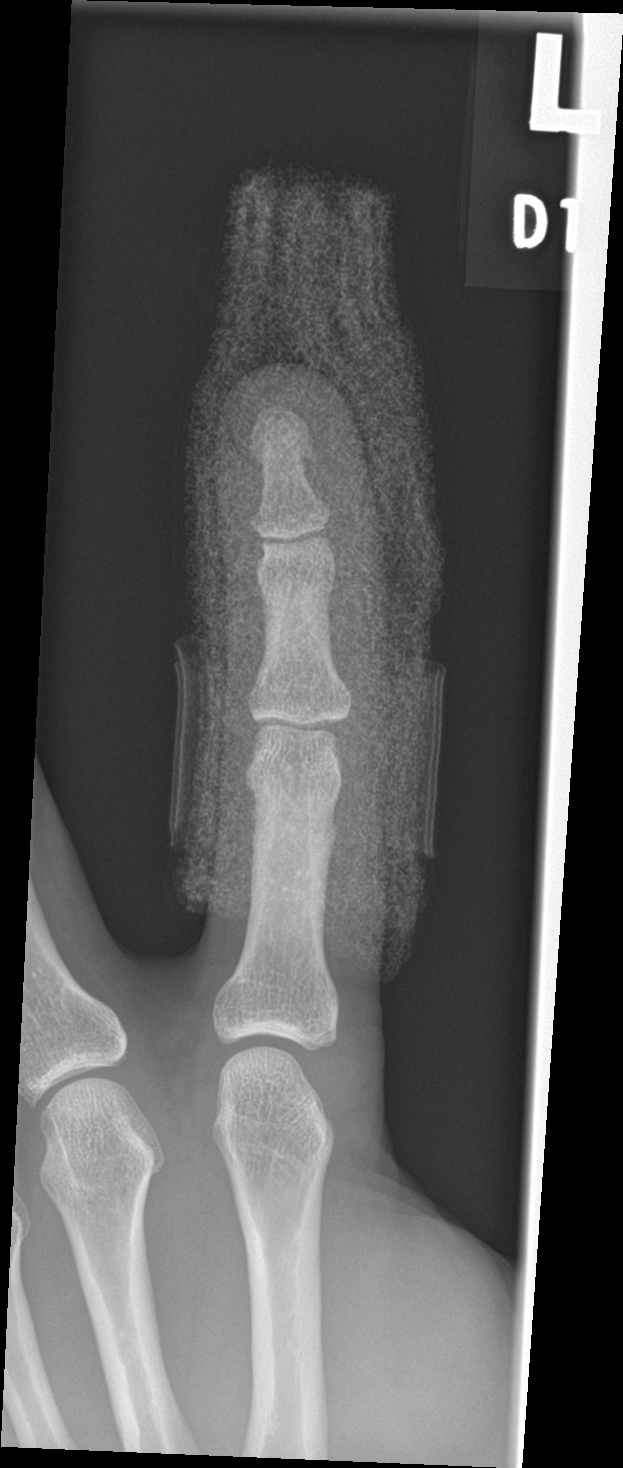

[finger obl]
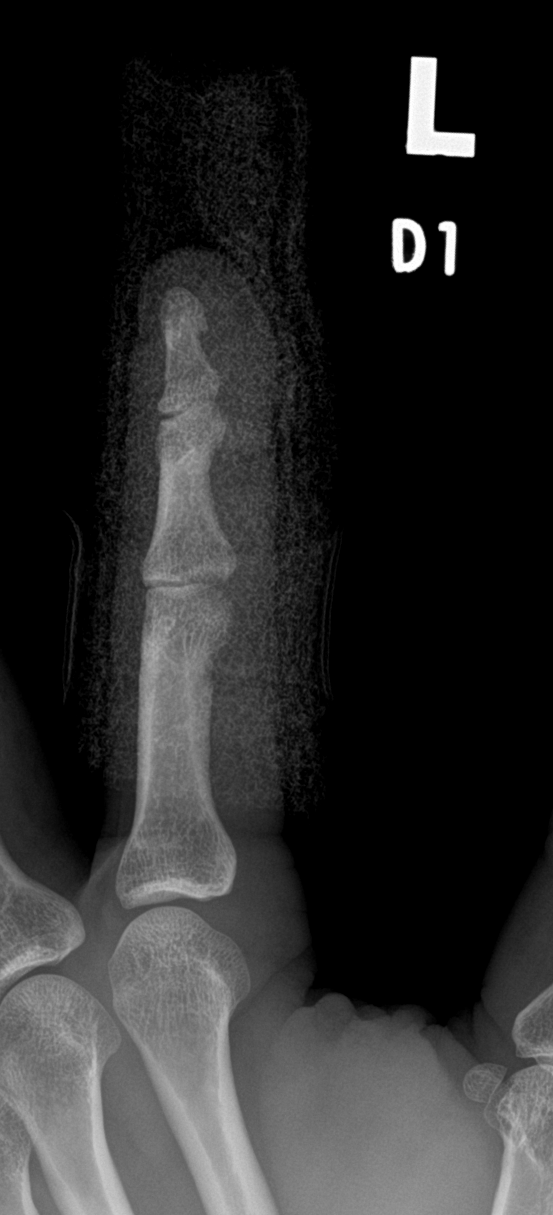

[finger lat]
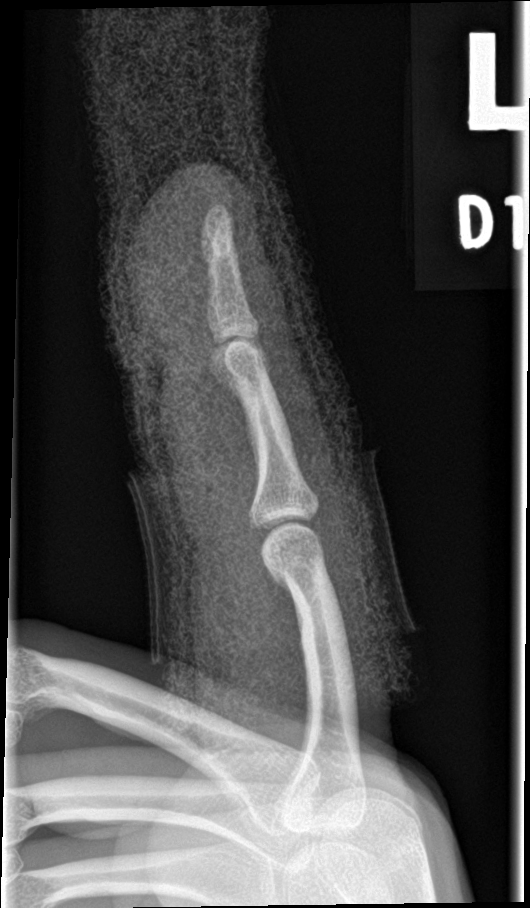

[3 of 3 positions shown; findings below may reference images not displayed]

FINDINGS: Radiopaque gauze is seen overlying the left index finger with
subsequently obscured osseous and soft tissue detail. There is no
evidence of fracture or dislocation. There is no evidence of
arthropathy or other focal bone abnormality. Soft tissues are
unremarkable.
IMPRESSION: Negative.

## 2024-05-16 ENCOUNTER — Ambulatory Visit
Admission: RE | Admit: 2024-05-16 | Discharge: 2024-05-16 | Disposition: A | Discharge status: Home / Self Care | Source: Ambulatory Visit | Attending: Family Medicine | Admitting: Family Medicine

## 2024-05-16 VITALS — BP 124/79 | HR 52 | Temp 98.5°F | Resp 18 | Ht 71.0 in | Wt 225.0 lb

## 2024-05-16 DIAGNOSIS — I8001 Phlebitis and thrombophlebitis of superficial vessels of right lower extremity: Secondary | ICD-10-CM | POA: Diagnosis not present

## 2024-05-16 DIAGNOSIS — I83891 Varicose veins of right lower extremities with other complications: Secondary | ICD-10-CM | POA: Diagnosis not present

## 2024-05-16 DIAGNOSIS — M7989 Other specified soft tissue disorders: Secondary | ICD-10-CM

## 2024-05-16 NOTE — ED Provider Notes (Signed)
 [facility]    CSN: [id number] Arrival date & time: 05/16/24  1853      History   Chief Complaint Chief Complaint  Patient presents with   Leg Swelling    Was bit by something Saturday a week ago, placed turned red and swelling has gone down the leg and now it's hard. Noticed last night swelling into the ankle. Did go to primary dr Friday who prescribed 500 mg cephalexin 2xdaily. Swelling begin Sunday - Entered by patient    HPI Christopher Donaldson is a 43 y.o. male.   See above history.  Patient did some outdoor work over a week ago and developed pain in his calf.  He had a hard red area.  He thought it might be an insect bite.  He went to his PCP and was given Keflex.  The area is spread.  There is a firmness to palpation.  He starting to have some swelling in his ankle.  He is here for follow-up.  No fever or chills.  He does have known varicose veins and diminished circulation in this leg.  He states it runs in his family.  No chest pain or shortness of breath.  No trauma    Past Medical History:  Diagnosis Date   Arthritis     There are no active problems to display for this patient.   Past Surgical History:  Procedure Laterality Date   arm surgery         Home Medications    Prior to Admission medications   Medication Sig Start Date End Date Taking? Authorizing Provider  cyclobenzaprine  (FLEXERIL ) 5 MG tablet Take 1-2 tablets (5-10 mg total) by mouth 2 (two) times daily as needed for muscle spasms. 08/01/19   Anitra Rocky KIDD, PA-C    Family History Family History  Problem Relation Age of Onset   Healthy Mother    Healthy Father     Social History Social History   Tobacco Use   Smoking status: Former    Types: Cigarettes   Smokeless tobacco: Never  Vaping Use   Vaping status: Never Used  Substance Use Topics   Alcohol use: Not Currently   Drug use: Never     Allergies   Patient has no known allergies.   Review of Systems Review of  Systems  See HPI Physical Exam Triage Vital Signs ED Triage Vitals  Encounter Vitals Group     BP 05/16/24 1902 124/79     Girls Systolic BP Percentile --      Girls Diastolic BP Percentile --      Boys Systolic BP Percentile --      Boys Diastolic BP Percentile --      Pulse Rate 05/16/24 1902 (!) 52     Resp 05/16/24 1902 18     Temp 05/16/24 1902 98.5 F (36.9 C)     Temp Source 05/16/24 1902 Oral     SpO2 05/16/24 1902 97 %     Weight 05/16/24 1904 225 lb (102.1 kg)     Height 05/16/24 1904 5' 11 (1.803 m)     Head Circumference --      Peak Flow --      Pain Score 05/16/24 1904 0     Pain Loc --      Pain Education --      Exclude from Growth Chart --    No data found.  Updated Vital Signs BP 124/79 (BP Location: Right Arm)  Pulse (!) 52   Temp 98.5 F (36.9 C) (Oral)   Resp 18   Ht 5' 11 (1.803 m)   Wt 102.1 kg   SpO2 97%   BMI 31.38 kg/m       Physical Exam Constitutional:      General: He is not in acute distress.    Appearance: He is well-developed.  HENT:     Head: Normocephalic and atraumatic.   Eyes:     Conjunctiva/sclera: Conjunctivae normal.     Pupils: Pupils are equal, round, and reactive to light.    Cardiovascular:     Rate and Rhythm: Normal rate.  Pulmonary:     Effort: Pulmonary effort is normal. No respiratory distress.   Musculoskeletal:        General: Normal range of motion.     Cervical back: Normal range of motion.     Right lower leg: Edema present.   Skin:    General: Skin is warm and dry.     Comments: On the right leg from the popliteal fossa down to just above the ankle there is a meandering erythematous streak that has a palpable cord in the center.  Minimally tender.  No tenderness in the deep popliteal fossa.  No pain with ankle dorsiflexion   Neurological:     Mental Status: He is alert.      UC Treatments / Results  Labs (all labs ordered are listed, but only abnormal results are displayed) Labs  Reviewed - No data to display  EKG   Radiology No results found.  Procedures Procedures (including critical care time)  Medications Ordered in UC Medications - No data to display  Initial Impression / Assessment and Plan / UC Course  I have reviewed the triage vital signs and the nursing notes.  Pertinent labs & imaging results that were available during my care of the patient were reviewed by me and considered in my medical decision making (see chart for details).     Explained that this is a superficial varicose vein/thrombophlebitis.  Discussed treatment.  Discussed follow-up Final Clinical Impressions(s) / UC Diagnoses   Final diagnoses:  Varicose veins of leg with edema, right  Thrombophlebitis of superficial veins of right lower extremity  Leg swelling     Discharge Instructions      Take ibuprofen 800 mg 3 times a day for the next few days Use warm compresses to the area Elevate the leg when you are able Follow-up with your primary care doctor for problems You may consider seeing a vascular surgeon to evaluate and possibly treat your varicose veins   ED Prescriptions   None    PDMP not reviewed this encounter.   Maranda Jamee Jacob, MD 05/16/24 [phone]

## 2024-05-16 NOTE — Discharge Instructions (Addendum)
 Take ibuprofen 800 mg 3 times a day for the next few days Use warm compresses to the area Elevate the leg when you are able Follow-up with your primary care doctor for problems You may consider seeing a vascular surgeon to evaluate and possibly treat your varicose veins

## 2024-05-16 NOTE — ED Triage Notes (Signed)
 Patient c/o right left lower swelling and pain x 10 days.  Patient did see his PCP on Friday and given Keflex for possible bug bite.  Now patient is having swelling in ankle now.  The swelling, redness and hard area is still there.

## 2024-05-25 ENCOUNTER — Other Ambulatory Visit: Payer: Self-pay

## 2024-05-25 ENCOUNTER — Ambulatory Visit (HOSPITAL_COMMUNITY)
Admission: RE | Admit: 2024-05-25 | Discharge: 2024-05-25 | Disposition: A | Discharge status: Home / Self Care | Source: Ambulatory Visit | Attending: Vascular Surgery | Admitting: Vascular Surgery

## 2024-05-25 DIAGNOSIS — I872 Venous insufficiency (chronic) (peripheral): Secondary | ICD-10-CM | POA: Insufficient documentation

## 2024-06-17 ENCOUNTER — Encounter (HOSPITAL_COMMUNITY)

## 2024-06-29 ENCOUNTER — Ambulatory Visit: Attending: Vascular Surgery | Admitting: Physician Assistant

## 2024-06-29 VITALS — BP 112/72 | HR 47 | Temp 97.7°F | Wt 231.1 lb

## 2024-06-29 DIAGNOSIS — I82811 Embolism and thrombosis of superficial veins of right lower extremities: Secondary | ICD-10-CM

## 2024-06-29 DIAGNOSIS — I872 Venous insufficiency (chronic) (peripheral): Secondary | ICD-10-CM

## 2024-06-29 NOTE — Progress Notes (Signed)
 Requested by:  Maranda Jamee Jacob, MD [address] [place],  [place] [address]  Reason for consultation: Thrombophlebitis   History of Present Illness   Christopher Donaldson is a 42 y.o. ([birth year]) male who presents for evaluation of superficial thrombophlebitis.  The patient says in early June he went to urgent care for a painful knot he developed in his right calf. This knot spread from behind his knee to his mid calf. He also developed redness and tenderness around this area. About 3 days after this knot got bigger, he noticed swelling in his right ankle. He was diagnosed with thrombophlebitis and was recommended conservative treatment including ibuprofen and warm compresses to the area.   He says that it took about 1 week total for the thrombophlebitis in his leg to improve. His redness and tenderness went away, however the hardened knot has remained.  Overall he feels much better and denies any pain in his leg.  Outside of this event, he has had no issues with leg swelling or discomfort.  He has no prior history of DVT.  Past Medical History:  Diagnosis Date   Arthritis     Past Surgical History:  Procedure Laterality Date   arm surgery      Social History   Socioeconomic History   Marital status: Married    Spouse name: Not on file   Number of children: Not on file   Years of education: Not on file   Highest education level: Not on file  Occupational History   Not on file  Tobacco Use   Smoking status: Former    Types: Cigarettes   Smokeless tobacco: Never  Vaping Use   Vaping status: Never Used  Substance and Sexual Activity   Alcohol use: Not Currently   Drug use: Never   Sexual activity: Yes  Other Topics Concern   Not on file  Social History Narrative   Not on file   Social Drivers of Health   Financial Resource Strain: Low Risk  (01/28/2024)   Received from [facility]   Overall Financial Resource Strain (CARDIA)    Difficulty of Paying Living Expenses:  Not hard at all  Food Insecurity: No Food Insecurity (01/28/2024)   Received from [facility]   Hunger Vital Sign    Within the past 12 months, you worried that your food would run out before you got the money to buy more.: Never true    Within the past 12 months, the food you bought just didn't last and you didn't have money to get more.: Never true  Transportation Needs: No Transportation Needs (01/28/2024)   Received from [facility] - Transportation    Lack of Transportation (Medical): No    Lack of Transportation (Non-Medical): No  Physical Activity: Unknown (01/28/2024)   Received from [facility]   Exercise Vital Sign    On average, how many days per week do you engage in moderate to strenuous exercise (like a brisk walk)?: 0 days    Minutes of Exercise per Session: Not on file  Stress: Stress Concern Present (01/28/2024)   Received from [facility] - Occupational Stress Questionnaire    Feeling of Stress : To some extent  Social Connections: Socially Integrated (01/28/2024)   Received from [facility]   Social Network    How would you rate your social network (family, work, friends)?: Good participation with social networks  Intimate Partner Violence:  Not At Risk (01/28/2024)   Received from [facility]   HITS    Over the last 12 months how often did your partner physically hurt you?: Never    Over the last 12 months how often did your partner insult you or talk down to you?: Never    Over the last 12 months how often did your partner threaten you with physical harm?: Never    Over the last 12 months how often did your partner scream or curse at you?: Never    Family History  Problem Relation Age of Onset   Healthy Mother    Healthy Father     Current Outpatient Medications  Medication Sig Dispense Refill   cyclobenzaprine  (FLEXERIL ) 5 MG tablet Take 1-2 tablets (5-10 mg total) by mouth 2 (two) times daily as  needed for muscle spasms. 30 tablet 0   No current facility-administered medications for this visit.    No Known Allergies  REVIEW OF SYSTEMS (negative unless checked):   Cardiac:  []  Chest pain or chest pressure? []  Shortness of breath upon activity? []  Shortness of breath when lying flat? []  Irregular heart rhythm?  Vascular:  []  Pain in calf, thigh, or hip brought on by walking? []  Pain in feet at night that wakes you up from your sleep? []  Blood clot in your veins? []  Leg swelling?  Pulmonary:  []  Oxygen at home? []  Productive cough? []  Wheezing?  Neurologic:  []  Sudden weakness in arms or legs? []  Sudden numbness in arms or legs? []  Sudden onset of difficult speaking or slurred speech? []  Temporary loss of vision in one eye? []  Problems with dizziness?  Gastrointestinal:  []  Blood in stool? []  Vomited blood?  Genitourinary:  []  Burning when urinating? []  Blood in urine?  Psychiatric:  []  Major depression  Hematologic:  []  Bleeding problems? []  Problems with blood clotting?  Dermatologic:  []  Rashes or ulcers?  Constitutional:  []  Fever or chills?  Ear/Nose/Throat:  []  Change in hearing? []  Nose bleeds? []  Sore throat?  Musculoskeletal:  []  Back pain? []  Joint pain? []  Muscle pain?   Physical Examination     Vitals:   06/29/24 0917  BP: 112/72  Pulse: (!) 47  Temp: 97.7 F (36.5 C)  TempSrc: Temporal  Weight: 231 lb 1.6 oz (104.8 kg)   Body mass index is 32.23 kg/m.  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  Hardened thrombus in small varicose vein in the mid thigh. Several reticular veins in both feet.  No  lower extremity edema Musculoskeletal: no muscle wasting or atrophy  Neurologic: A&O X 3;  No focal weakness or paresthesias are detected Psychiatric:  The pt has Normal affect.  Non-invasive Vascular Imaging   RLE Venous Insufficiency Duplex (05/25/2024):   +----------------+---------+------+-----------+------------+---------------  ----+  RIGHT          Reflux NoRefluxReflux TimeDiameter cmsComments                                        Yes                                               +----------------+---------+------+-----------+------------+---------------  ----+  CFV                      yes   >1 second                                   +----------------+---------+------+-----------+------------+---------------  ----+  FV mid          no                                                          +----------------+---------+------+-----------+------------+---------------  ----+  Popliteal      no                                                          +----------------+---------+------+-----------+------------+---------------  ----+  GSV at [facility]                yes    >500 ms      .72                           +----------------+---------+------+-----------+------------+---------------  ----+  GSV prox thigh  no                            .54                           +----------------+---------+------+-----------+------------+---------------  ----+  GSV mid thigh   no                            .42                           +----------------+---------+------+-----------+------------+---------------  ----+  GSV dist thigh  no                            .36     out of fascia         +----------------+---------+------+-----------+------------+---------------  ----+  GSV at knee                                   .64     age  indeterminate                                                           thrombus              +----------------+---------+------+-----------+------------+---------------  ----+  GSV prox calf                                 .  24     age  indeterminate                                                          thrombus              +----------------+---------+------+-----------+------------+---------------  ----+  GSV mid calf                                  .1     age  indeterminate                                                          thrombus              +----------------+---------+------+-----------+------------+---------------  ----+  SSV at [facility]      no                            .37                           +----------------+---------+------+-----------+------------+---------------  ----+  SSV prox calf   no                            .36                           +----------------+---------+------+-----------+------------+---------------  ----+  distal thigh VV                                       age  indeterminate                                                          thrombus              +----------------+---------+------+-----------+------------+---------------  ----+  proximal calf VV                                      age  indeterminate                                                          thrombus              +----------------+---------+------+-----------+------------+---------------  ----+     Medical Decision Making   Christopher Donaldson is a 43 y.o. male who presents for evaluation of thrombophlebitis  Based on the patient's duplex, there is reflux  in the right common femoral vein and greater saphenous vein at the saphenofemoral junction.  There is age-indeterminate thrombus in the greater saphenous vein from the knee to the mid calf.  There is also age-indeterminate thrombus in a varicose vein of the distal thigh and proximal calf.  There is no  evidence of DVT on duplex The patient says back in June he developed a painful knot behind his right knee that eventually extended to his mid calf.  There was surrounding erythema, tenderness, and pain to this area.  Shortly after developing this knot he had some right ankle swelling.  He was evaluated by an urgent care and was diagnosed with thrombophlebitis Thankfully the patient's thrombophlebitis episode was self-limiting and his symptoms went away after about a week.  He has had no recurrent episodes since then.  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  Outside of this event, he has no issues with lower extremity discomfort or swelling. 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  There is no associated swelling or tenderness to this area.  He has no lower extremity swelling. I explained to the patient that he does have evidence of venous insufficiency on duplex, however he is not a candidate for saphenous vein ablation.  He also does not require any anticoagulation for his thrombus, given that it was in a short segment of the greater saphenous vein and far from the saphenofemoral junction.  I have explained to the patient that he may have future episodes of thrombophlebitis, however this can be treated with conservative management including ibuprofen and warm compress.  I have reassured him that this does not put him at a larger risk for developing a DVT.  I have also encouraged him to wear compression stockings daily and elevate his legs to help with venous blood flow return He can follow-up with our office as needed   Christopher SHAUNNA Holster, PA-C [facility] Office: [phone]  06/29/2024, 1:52 PM  Clinic MD: She[place]
